# Patient Record
Sex: Male | Born: 1965 | Race: White | Marital: Married | Smoking: Never smoker
Health system: Southern US, Community
[De-identification: ages and names within clinical notes are randomized; demographics above are authoritative.]

---

## 2019-03-15 DIAGNOSIS — M25551 Pain in right hip: Secondary | ICD-10-CM | POA: Diagnosis not present

## 2019-03-15 DIAGNOSIS — M5136 Other intervertebral disc degeneration, lumbar region: Secondary | ICD-10-CM | POA: Diagnosis not present

## 2019-03-15 DIAGNOSIS — M545 Low back pain: Secondary | ICD-10-CM | POA: Diagnosis not present

## 2019-03-21 DIAGNOSIS — M1611 Unilateral primary osteoarthritis, right hip: Secondary | ICD-10-CM | POA: Diagnosis not present

## 2019-03-21 DIAGNOSIS — M5136 Other intervertebral disc degeneration, lumbar region: Secondary | ICD-10-CM | POA: Diagnosis not present

## 2019-03-21 DIAGNOSIS — M47816 Spondylosis without myelopathy or radiculopathy, lumbar region: Secondary | ICD-10-CM | POA: Diagnosis not present

## 2019-03-21 DIAGNOSIS — M25551 Pain in right hip: Secondary | ICD-10-CM | POA: Diagnosis not present

## 2019-05-08 DIAGNOSIS — M1611 Unilateral primary osteoarthritis, right hip: Secondary | ICD-10-CM | POA: Diagnosis not present

## 2019-05-08 DIAGNOSIS — M25551 Pain in right hip: Secondary | ICD-10-CM | POA: Diagnosis not present

## 2019-05-17 ENCOUNTER — Other Ambulatory Visit: Payer: Self-pay | Admitting: Orthopaedic Surgery

## 2019-05-17 DIAGNOSIS — M25551 Pain in right hip: Secondary | ICD-10-CM

## 2019-06-16 ENCOUNTER — Other Ambulatory Visit: Payer: Self-pay

## 2019-06-16 ENCOUNTER — Ambulatory Visit
Admission: RE | Admit: 2019-06-16 | Discharge: 2019-06-16 | Disposition: A | Discharge status: Home / Self Care | Payer: Self-pay | Source: Ambulatory Visit | Attending: Orthopaedic Surgery | Admitting: Orthopaedic Surgery

## 2019-06-16 DIAGNOSIS — M25551 Pain in right hip: Secondary | ICD-10-CM

## 2019-06-29 DIAGNOSIS — M1611 Unilateral primary osteoarthritis, right hip: Secondary | ICD-10-CM | POA: Diagnosis not present

## 2019-07-12 DIAGNOSIS — I10 Essential (primary) hypertension: Secondary | ICD-10-CM | POA: Diagnosis not present

## 2019-07-26 DIAGNOSIS — Z0000 Encounter for general adult medical examination without abnormal findings: Secondary | ICD-10-CM | POA: Diagnosis not present

## 2019-07-26 DIAGNOSIS — Z01818 Encounter for other preprocedural examination: Secondary | ICD-10-CM | POA: Diagnosis not present

## 2019-07-26 DIAGNOSIS — I10 Essential (primary) hypertension: Secondary | ICD-10-CM | POA: Diagnosis not present

## 2019-07-26 DIAGNOSIS — Z1331 Encounter for screening for depression: Secondary | ICD-10-CM | POA: Diagnosis not present

## 2019-08-09 DIAGNOSIS — I10 Essential (primary) hypertension: Secondary | ICD-10-CM | POA: Diagnosis not present

## 2019-08-13 DIAGNOSIS — M1611 Unilateral primary osteoarthritis, right hip: Secondary | ICD-10-CM | POA: Diagnosis not present

## 2019-08-23 DIAGNOSIS — M1611 Unilateral primary osteoarthritis, right hip: Secondary | ICD-10-CM | POA: Diagnosis not present

## 2019-09-03 DIAGNOSIS — M25551 Pain in right hip: Secondary | ICD-10-CM | POA: Diagnosis not present

## 2019-09-03 DIAGNOSIS — Z471 Aftercare following joint replacement surgery: Secondary | ICD-10-CM | POA: Diagnosis not present

## 2019-09-03 DIAGNOSIS — Z96641 Presence of right artificial hip joint: Secondary | ICD-10-CM | POA: Diagnosis not present

## 2019-11-09 DIAGNOSIS — I10 Essential (primary) hypertension: Secondary | ICD-10-CM | POA: Diagnosis not present

## 2020-06-11 ENCOUNTER — Ambulatory Visit (INDEPENDENT_AMBULATORY_CARE_PROVIDER_SITE_OTHER): Payer: BLUE CROSS/BLUE SHIELD | Admitting: Pulmonary Disease

## 2020-06-11 ENCOUNTER — Other Ambulatory Visit: Payer: Self-pay

## 2020-06-11 ENCOUNTER — Encounter: Payer: Self-pay | Admitting: Pulmonary Disease

## 2020-06-11 VITALS — BP 138/88 | HR 84 | Temp 97.6°F | Ht 72.0 in | Wt 306.2 lb

## 2020-06-11 DIAGNOSIS — R0683 Snoring: Secondary | ICD-10-CM

## 2020-06-11 NOTE — Progress Notes (Signed)
Gerald Matthews    [id number]    27-Jul-1965  Primary Care Physician:Schultz, Jonetta Speak, MD  Referring Physician: No referring provider defined for this encounter.  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 amLODipine-valsartan (EXFORGE) 10-320 MG tablet Take 1 tablet by mouth daily.  . cetirizine (ZYRTEC) 10 MG tablet Take 10 mg by mouth daily.  Marland Kitchen omeprazole (PRILOSEC) 10 MG capsule Take 10 mg by mouth daily.  . polyethylene glycol-electrolytes (NULYTELY) 420 g solution USE AS DIRECTED FOR BOWEL PREP  . rosuvastatin (CRESTOR) 10 MG tablet Take 10 mg by mouth at bedtime.   No facility-administered encounter medications on file as of 06/11/2020.    Allergies as of 06/11/2020  . (Not on File)    History reviewed. No pertinent past medical history.  History reviewed. No pertinent surgical history.  History reviewed. No pertinent family history.  Social History   Socioeconomic History  . Marital status: Married    Spouse name: Not on file  . Number of children: Not on file  . Years of education: Not on file  . Highest education level: Not on file  Occupational History  . Not on file  Tobacco Use  . Smoking status: Never Smoker  . Smokeless tobacco: Never Used  Substance and Sexual Activity  . Alcohol use: Not on file  . Drug use: Not  on file  . Sexual activity: Not on file  Other Topics Concern  . Not on file  Social History Narrative  . Not on file   Social Determinants of Health   Financial Resource Strain: Not on file  Food Insecurity: Not on file  Transportation Needs: Not on file  Physical Activity: Not on file  Stress: Not on file  Social Connections: Not on file  Intimate Partner Violence: Not on file    Review of Systems  Respiratory: Positive for shortness of breath.   Psychiatric/Behavioral: Positive for sleep disturbance.    Vitals:   06/11/20 1606  BP: 138/88  Pulse: 84  Temp: 97.6 F (36.4 C)  SpO2: 97%     Physical Exam Constitutional:      Appearance: He is obese.  HENT:     Head: Normocephalic.     Mouth/Throat:     Mouth: Mucous membranes are moist.     Comments: Mallampati 4, crowded oropharynx, macroglossia Eyes:     General:        Right eye: No discharge.        Left eye: No discharge.     Pupils: Pupils are equal, round, and reactive to light.  Cardiovascular:     Rate and Rhythm: Normal rate and regular rhythm.     Heart sounds: No murmur heard. No friction rub.  Pulmonary:     Effort: No respiratory distress.     Breath sounds: No stridor. No wheezing  or rhonchi.  Musculoskeletal:     Cervical back: No rigidity or tenderness.  Neurological:     Mental Status: He is alert.  Psychiatric:        Mood and Affect: Mood normal.   Epworth Sleepiness Scale of 11   Assessment:  Excessive daytime sleepiness  High probability of significant obstructive sleep apnea  Pathophysiology of sleep disordered breathing discussed with the patient Treatment options for sleep disordered breathing discussed with the patient  Importance of weight loss discussed with the patient  Plan/Recommendations: We will schedule patient for home sleep study  Encouraged to work on weight loss efforts  Tentative follow-up in about 4 to 5 months  Encouraged to call with any  significant concerns   Virl Diamond MD [facility] 06/11/2020, 5:15 PM  CC: No ref. provider found

## 2020-06-11 NOTE — Patient Instructions (Signed)
High probability of significant obstructive sleep apnea  I will schedule you for a home sleep study We will update your results as soon as reviewed  I will see you back in 4 to 5 months  Call with significant concerns  Encourage graded exercises, weight loss efforts   Sleep Apnea Sleep apnea affects breathing during sleep. It causes breathing to stop for a short time or to become shallow. It can also increase the risk of:  Heart attack.  Stroke.  Being very overweight (obese).  Diabetes.  Heart failure.  Irregular heartbeat. The goal of treatment is to help you breathe normally again. What are the causes? There are three kinds of sleep apnea:  Obstructive sleep apnea. This is caused by a blocked or collapsed airway.  Central sleep apnea. This happens when the brain does not send the right signals to the muscles that control breathing.  Mixed sleep apnea. This is a combination of obstructive and central sleep apnea. The most common cause of this condition is a collapsed or blocked airway. This can happen if:  Your throat muscles are too relaxed.  Your tongue and tonsils are too large.  You are overweight.  Your airway is too small.   What increases the risk?  Being overweight.  Smoking.  Having a small airway.  Being older.  Being male.  Drinking alcohol.  Taking medicines to calm yourself (sedatives or tranquilizers).  Having family members with the condition. What are the signs or symptoms?  Trouble staying asleep.  Being sleepy or tired during the day.  Getting angry a lot.  Loud snoring.  Headaches in the morning.  Not being able to focus your mind (concentrate).  Forgetting things.  Less interest in sex.  Mood swings.  Personality changes.  Feelings of sadness (depression).  Waking up a lot during the night to pee (urinate).  Dry mouth.  Sore throat. How is this diagnosed?  Your medical history.  A physical exam.  A  test that is done when you are sleeping (sleep study). The test is most often done in a sleep lab but may also be done at home. How is this treated?  Sleeping on your side.  Using a medicine to get rid of mucus in your nose (decongestant).  Avoiding the use of alcohol, medicines to help you relax, or certain pain medicines (narcotics).  Losing weight, if needed.  Changing your diet.  Not smoking.  Using a machine to open your airway while you sleep, such as: ? An oral appliance. This is a mouthpiece that shifts your lower jaw forward. ? A CPAP device. This device blows air through a mask when you breathe out (exhale). ? An EPAP device. This has valves that you put in each nostril. ? A BPAP device. This device blows air through a mask when you breathe in (inhale) and breathe out.  Having surgery if other treatments do not work. It is important to get treatment for sleep apnea. Without treatment, it can lead to:  High blood pressure.  Coronary artery disease.  In men, not being able to have an erection (impotence).  Reduced thinking ability.   Follow these instructions at home: Lifestyle  Make changes that your doctor recommends.  Eat a healthy diet.  Lose weight if needed.  Avoid alcohol, medicines to help you relax, and some pain medicines.  Do not use any products that contain nicotine or tobacco, such as cigarettes, e-cigarettes, and chewing tobacco. If you need help quitting,  ask your doctor. General instructions  Take over-the-counter and prescription medicines only as told by your doctor.  If you were given a machine to use while you sleep, use it only as told by your doctor.  If you are having surgery, make sure to tell your doctor you have sleep apnea. You may need to bring your device with you.  Keep all follow-up visits as told by your doctor. This is important. Contact a doctor if:  The machine that you were given to use during sleep bothers you or does  not seem to be working.  You do not get better.  You get worse. Get help right away if:  Your chest hurts.  You have trouble breathing in enough air.  You have an uncomfortable feeling in your back, arms, or stomach.  You have trouble talking.  One side of your body feels weak.  A part of your face is hanging down. These symptoms may be an emergency. Do not wait to see if the symptoms will go away. Get medical help right away. Call your local emergency services (911 in the [place] Do not drive yourself to the hospital. Summary  This condition affects breathing during sleep.  The most common cause is a collapsed or blocked airway.  The goal of treatment is to help you breathe normally while you sleep. This information is not intended to replace advice given to you by your health care provider. Make sure you discuss any questions you have with your health care provider. Document Revised: 10/21/2017 Document Reviewed: 08/30/2017 Elsevier Patient Education  2021 [employer].

## 2020-07-31 ENCOUNTER — Other Ambulatory Visit: Payer: Self-pay

## 2020-07-31 ENCOUNTER — Ambulatory Visit: Payer: BLUE CROSS/BLUE SHIELD

## 2020-08-06 ENCOUNTER — Ambulatory Visit: Payer: BLUE CROSS/BLUE SHIELD

## 2020-08-06 ENCOUNTER — Other Ambulatory Visit: Payer: Self-pay

## 2020-08-06 DIAGNOSIS — G4733 Obstructive sleep apnea (adult) (pediatric): Secondary | ICD-10-CM

## 2020-08-09 ENCOUNTER — Telehealth: Payer: Self-pay | Admitting: Pulmonary Disease

## 2020-08-09 DIAGNOSIS — G4733 Obstructive sleep apnea (adult) (pediatric): Secondary | ICD-10-CM

## 2020-08-09 DIAGNOSIS — R0683 Snoring: Secondary | ICD-10-CM

## 2020-08-09 NOTE — Telephone Encounter (Signed)
Call patient  Sleep study result  Date of study: 08/06/2020  Impression: Severe obstructive sleep apnea Severe oxygen desaturations  Recommendation: Recommend in lab titration study for treatment of severe obstructive sleep apnea  Auto titrating CPAP may be an option of treatment but may not adequately treat the severe oxygen desaturations  Weight loss measures encouraged Caution against driving when sleepy  Follow-up as previously scheduled

## 2020-08-18 NOTE — Telephone Encounter (Signed)
I called and spoke with patient regarding HST results and Dr. Beverley Fiedler. Patient verbalized understanding and I sent in order for CPAP titration study and informed patient that the lab will be in contact to schedule appt. Patient verbalized understanding, nothing further needed.

## 2020-10-01 ENCOUNTER — Ambulatory Visit (HOSPITAL_BASED_OUTPATIENT_CLINIC_OR_DEPARTMENT_OTHER): Payer: BLUE CROSS/BLUE SHIELD | Attending: Pulmonary Disease | Admitting: Pulmonary Disease

## 2020-10-01 ENCOUNTER — Other Ambulatory Visit: Payer: Self-pay

## 2020-10-01 ENCOUNTER — Encounter (HOSPITAL_BASED_OUTPATIENT_CLINIC_OR_DEPARTMENT_OTHER): Payer: Self-pay | Admitting: Pulmonary Disease

## 2020-10-01 DIAGNOSIS — R0683 Snoring: Secondary | ICD-10-CM

## 2020-10-01 DIAGNOSIS — G4733 Obstructive sleep apnea (adult) (pediatric): Secondary | ICD-10-CM

## 2020-10-01 DIAGNOSIS — Z79899 Other long term (current) drug therapy: Secondary | ICD-10-CM | POA: Diagnosis not present

## 2020-10-01 HISTORY — DX: Snoring: R06.83

## 2020-10-06 ENCOUNTER — Telehealth: Payer: Self-pay | Admitting: Pulmonary Disease

## 2020-10-06 DIAGNOSIS — G4733 Obstructive sleep apnea (adult) (pediatric): Secondary | ICD-10-CM

## 2020-10-06 DIAGNOSIS — R0683 Snoring: Secondary | ICD-10-CM

## 2020-10-06 NOTE — Procedures (Signed)
POLYSOMNOGRAPHY  Last, First: Gerald Matthews MRN: [id number] Gender: Male Age (years): 71 Weight (lbs): 300 DOB: 09/01/1965 BMI: 41 Primary Care: No PCP Epworth Score: <br> Referring: Tomma Lightning MD Technician: Ulyess Mort Interpreting: Tomma Lightning MD Study Type: BiPAP Ordered Study Type: CPAP Study date: 10/01/2020 Location: [facility] CLINICAL INFORMATION Gerald Matthews is a 55 year old Male and was referred to the sleep center for evaluation of N/A. Indications include N/A.   Most recent polysomnogram dated 08/06/2020 revealed an AHI of 91.1/h. MEDICATIONS Patient self administered medications include: METOPROLOL, CRESTOR, ZYRTEC, OMEPRAZOLE. Medications administered during study include No sleep medicine administered.  SLEEP STUDY TECHNIQUE The patient underwent an attended overnight polysomnography titration to assess the effects of BIPAP therapy. The following variables were monitored: EEG(C4-A1, C3-A2, O1-A2, O2-A1), EOG, submental and leg EMG, ECG, oxyhemoglobin saturation by pulse oximetry, thoracic and abdominal respiratory effort belts, nasal/oral airflow by pressure sensor, body position sensor and snoring sensor. BIPAP pressure was titrated to eliminate apneas, hypopneas and oxygen desaturation. Hypopneas were scored per AASM definition IB (4% desaturation)  TECHNICIAN COMMENTS Comments added by Technician: Patient was ordered as a Cpap titration. Comments added by Scorer: N/A SLEEP ARCHITECTURE The study was initiated at 9:50:44 PM and terminated at 5:12:20 AM. Total recorded time was 441.6 minutes. EEG confirmed total sleep time was 230.3 minutes yielding a sleep efficiency of 52.1%%. Sleep onset after lights out was 58.8 minutes with a REM latency of 90.0 minutes. The patient spent 21.5%% of the night in stage N1 sleep, 64.0%% in stage N2 sleep, 0.0%% in stage N3 and 14.6% in REM. The Arousal Index was 52.9/hour. RESPIRATORY PARAMETERS The overall AHI was  46.1 per hour, and the RDI was 62.3 events/hour with a central apnea index of 15.4per hour. The most appropriate setting of BiPAP was 29/25 cm H2O. At this setting, the sleep efficiency was 98 % and the patient was supine for 0%. The AHI was 2.6 events per hour, and the RDI was 5.1 events/hour (with 15.4 central events) and the arousal index was 12.8 per hour.The oxygen nadir was 93.0% during sleep.    The cumulative time under 88% oxygen saturation was 5.5 minutes  LEG MOVEMENT DATA The total leg movements were 63 with a resulting leg movement index of 16.4. Associated arousal with leg movement index was 1.3. CARDIAC DATA The underlying cardiac rhythm was most consistent with sinus rhythm. Mean heart rate during sleep was 60.6 bpm. Additional rhythm abnormalities include PVCs.  IMPRESSIONS - Severe Obstructive Sleep apnea(OSA) Optimal pressure attained. - Electrocardiographic data showed presence of PVCs. - Mild Oxygen Desaturation - The patient snored with moderate snoring volume. - No significant periodic leg movements(PLMs) during sleep. However, no significant associated arousals.  DIAGNOSIS - Obstructive Sleep Apnea (G47.33)  RECOMMENDATIONS - Titrated to BiPAP therapy on 29/25 cm H2O - Pressure may not be well tolerated, recommend BiPAP ST 24/20 cm H2O with a backup rate of 12, with a medium size [employer] full face mask Simplus mask and heated humidification - Avoid alcohol, sedatives and other CNS depressants that may worsen sleep apnea and disrupt normal sleep architecture. - Sleep hygiene should be reviewed to assess factors that may improve sleep quality. - Weight management and regular exercise should be initiated or continued. - Close clinical follow-up optimization of treatment. - Return to Sleep Center for re-evaluation after 4 weeks of therapy  [Electronically signed] 10/06/2020 07:37 AM  Virl Diamond MD NPI: [id number]

## 2020-10-06 NOTE — Telephone Encounter (Signed)
Call patient  Sleep study result  Date of study: 9/14/202  Impression: Severe obstructive sleep apnea  Recommendation: DME referral  Recommend BiPAP ST 24/20 cm H2O with a backup rate of 12, with a medium size [employer] full face mask Simplus mask and heated humidification  Encourage weight loss measures  Follow-up in the office 4 to 6 weeks following initiation of treatment

## 2020-10-08 NOTE — Telephone Encounter (Signed)
I called and spoke with patient regarding HST results and Dr. Beverley Fiedler. Patient verbalized understanding and I have sent in order for new BiPAP start and informed patient DME company will be in touch next and to call the office and make a 4-6 week f/u appt after getting machine. Patient verbalized understanding, nothing further needed.

## 2020-10-24 ENCOUNTER — Telehealth: Payer: Self-pay | Admitting: Pulmonary Disease

## 2020-10-24 NOTE — Progress Notes (Signed)
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

## 2020-10-24 NOTE — Telephone Encounter (Signed)
I did place an addendum to the sleep study results and a note in the chart

## 2020-10-24 NOTE — Telephone Encounter (Signed)
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

## 2020-10-24 NOTE — Telephone Encounter (Signed)
I have been advised that we are in need of the following to process this order:    Updated RX and Rx needs to state BiPAP ST and include correct diagnosis code, in this case it would actually be G47.37 (Central sleep apnea). Also need signed interp for the 10/01/20 TT. The signature page that was included with the titration was the Rx for the TT, signed 08/18/20   Thank you,   Luellen Pucker

## 2020-10-29 ENCOUNTER — Telehealth: Payer: Self-pay | Admitting: Pulmonary Disease

## 2020-10-29 DIAGNOSIS — R0683 Snoring: Secondary | ICD-10-CM

## 2020-10-29 DIAGNOSIS — G4733 Obstructive sleep apnea (adult) (pediatric): Secondary | ICD-10-CM

## 2020-10-29 NOTE — Telephone Encounter (Signed)
I have placed the new order for BIPAP.  Will forward back to [facility] to make them aware.

## 2020-10-29 NOTE — Telephone Encounter (Signed)
We will send the order

## 2020-10-29 NOTE — Telephone Encounter (Signed)
Burna Forts; Darius Bump Hello,   I just got word back that this will not work. I was told the order must be updated to BIPAP ST and correct dx code and it has to be on the order , can not use a note.   Thank you,   Luellen Pucker

## 2021-02-02 ENCOUNTER — Telehealth: Payer: Self-pay | Admitting: Pulmonary Disease

## 2021-02-02 NOTE — Telephone Encounter (Signed)
BiPAP compliance report reviewed  98% compliance up until 02/01/2021, 39-month download Average use of 5 hours 27 minutes Machine set up 24/20 with backup rate of 12 Residual AHI of 6.2 Does appear to have occasional leaks  Follow-up as previously scheduled

## 2021-02-10 ENCOUNTER — Telehealth: Payer: Self-pay | Admitting: Pulmonary Disease

## 2021-02-10 NOTE — Telephone Encounter (Signed)
Called patient and told him I would send a message to Francesco Runner to figure out where that SD card is and that she would get back in touch with him about the reading and the card.

## 2021-02-11 NOTE — Telephone Encounter (Signed)
I called the patient and he is aware that he can pick up his SD card and he will pick up. I have made a follow up for the patient.

## 2021-03-31 ENCOUNTER — Other Ambulatory Visit: Payer: Self-pay

## 2021-03-31 ENCOUNTER — Ambulatory Visit: Payer: BLUE CROSS/BLUE SHIELD | Admitting: Pulmonary Disease

## 2021-03-31 ENCOUNTER — Encounter: Payer: Self-pay | Admitting: Pulmonary Disease

## 2021-03-31 VITALS — BP 138/80 | HR 89 | Temp 98.0°F | Ht 72.0 in | Wt 314.0 lb

## 2021-03-31 DIAGNOSIS — G4733 Obstructive sleep apnea (adult) (pediatric): Secondary | ICD-10-CM

## 2021-03-31 NOTE — Patient Instructions (Signed)
I will see you about 3 months from here ? ?We will contact the medical supply company to change her pressure from 24/20 to 22/18 ? ?Call at any time if you feel the pressure is not well-tolerated ? ?Call with any other significant concerns ? ?Continue weight loss efforts ?

## 2021-03-31 NOTE — Progress Notes (Signed)
? ?      Gerald Matthews    [id number]    [birth year] ? ?Primary Care Physician:Schultz, Jonetta Speak, MD ? ?Referring Physician: Paulina Fusi, MD ?[address] ?[address] ?[place],  [place] [address] ? ?Chief complaint:  ?Patient being seen for severe obstructive sleep apnea on BiPAP therapy ? ?HPI: ? ?BiPAP is helping ?Feels better ?Daytime sleepiness is better ?Feels pressure may be too high ? ?Is able to get a good number of hours of sleep ?Wakes up feeling he has more energy ?Activity tolerance is better ? ?Usually goes to bed between 11 and 12 ?Takes him about 30 minutes to an hour to fall asleep ?4-5 awakenings ?Final wake up time between 7 AM and 8 AM ? ?Admits to dryness of his mouth in the morning ?Occasional headaches ?Rare night sweats ?Dad did snore ?Never smoked ? ?He does have a history of hypertension, allergies, hypercholesterolemia ?Blood pressure is better ? 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 ?Outpatient Encounter Medications as of 03/31/2021  ?Medication Sig  ? amLODipine-valsartan (EXFORGE) 10-320 MG tablet Take 1 tablet by mouth daily.  ? cetirizine (ZYRTEC) 10 MG tablet Take 10 mg by mouth as needed for allergies.  ? omeprazole (PRILOSEC) 10 MG capsule Take 10 mg by mouth as needed.  ? rosuvastatin (CRESTOR) 10 MG tablet Take 10 mg by mouth at bedtime.  ? [DISCONTINUED] cetirizine (ZYRTEC) 10 MG tablet Take 10 mg by mouth daily. (Patient not taking: Reported on 03/31/2021)  ? [DISCONTINUED] metoprolol succinate (TOPROL-XL) 100 MG 24 hr tablet Take 100 mg by mouth daily. (Patient not taking: Reported on 03/31/2021)  ? [DISCONTINUED] polyethylene glycol-electrolytes (NULYTELY) 420 g solution USE AS DIRECTED FOR BOWEL PREP (Patient not taking: Reported on 03/31/2021)  ? ?No facility-administered encounter medications on file as of 03/31/2021.  ? ? ?Allergies as of 03/31/2021  ? (No Known Allergies)  ? ? ?Past Medical History:  ?Diagnosis Date  ? Snoring  10/01/2020  ? ? ?No past surgical history on file. ? ?No family history on file. ? ?Social History  ? ?Socioeconomic History  ? Marital status: Married  ?  Spouse name: Not on file  ? Number of children: Not on file  ? Years of education: Not on file  ? Highest education level: Not on file  ?Occupational History  ? Not on file  ?Tobacco Use  ? Smoking status: Never  ? Smokeless tobacco: Never  ?Substance and Sexual Activity  ? Alcohol use: Not on file  ? Drug use: Not on file  ? Sexual activity: Not on file  ?Other Topics Concern  ? Not on file  ?Social History Narrative  ? Not on file  ? ?Social Determinants of Health  ? ?Financial Resource Strain: Not on file  ?Food Insecurity: Not on file  ?Transportation Needs: Not on file  ?Physical Activity: Not on file  ?Stress: Not on file  ?Social Connections: Not on file  ?Intimate Partner Violence: Not on file  ? ? ?Review of Systems  ?Respiratory:  Positive for apnea and shortness of breath.   ?Psychiatric/Behavioral:  Positive for sleep disturbance.   ? ?Vitals:  ? 03/31/21 1045  ?BP: 138/80  ?Pulse: 89  ?Temp: 98 ?F (36.7 ?C)  ?SpO2: 100%  ? ? ? ?Physical Exam ?Constitutional:   ?   Appearance: He is obese.  ?HENT:  ?   Head: Normocephalic.  ?   Mouth/Throat:  ?   Mouth: Mucous membranes are  moist.  ?   Comments: Mallampati 4, crowded oropharynx, macroglossia ?Eyes:  ?   General:     ?   Right eye: No discharge.     ?   Left eye: No discharge.  ?   Pupils: Pupils are equal, round, and reactive to light.  ?Cardiovascular:  ?   Rate and Rhythm: Normal rate and regular rhythm.  ?   Heart sounds: No murmur heard. ?  No friction rub.  ?Pulmonary:  ?   Effort: No respiratory distress.  ?   Breath sounds: No stridor. No wheezing or rhonchi.  ?Musculoskeletal:  ?   Cervical back: No rigidity or tenderness.  ?Neurological:  ?   Mental Status: He is alert.  ?Psychiatric:     ?   Mood and Affect: Mood normal.  ?Epworth Sleepiness Scale of 11 ? ?Compliance data reviewed showing 94%  compliance with BiPAP ?BiPAP set between 24/20 with a backup rate of 12 ?Residual AHI of 4.5 ? ?Assessment:  ?Severe obstructive sleep apnea ? ?Tolerating BiPAP well ? ?Feels BiPAP pressure may be too high ? ?Significant improvement in symptoms ? ?Does note improved sleep quality ? ?Obesity ?-Weight is trending down ? ?Plan/Recommendations: ? ?We will make BiPAP pressure changes ? ?We will decrease pressure from 24/22 22/18, maintain backup rate of 12 ? ?Encourage continuing weight loss efforts ? ?As we are still making changes to his pressure ?We will see him in 3 months ? ?Encouraged to give [place] a call with any significant concerns or not tolerating pressure changes ? ?Significance of weight loss efforts discussed ? ? ?Virl Diamond MD ?[facility] ?03/31/2021, 11:20 AM ? ?CC: Paulina Fusi, MD ? ? ?

## 2021-04-10 ENCOUNTER — Telehealth: Payer: Self-pay | Admitting: Pulmonary Disease

## 2021-04-10 DIAGNOSIS — G4733 Obstructive sleep apnea (adult) (pediatric): Secondary | ICD-10-CM

## 2021-04-17 NOTE — Telephone Encounter (Signed)
Not changing from BiPAP ST ? ?Decrease pressure to 22/18, backup rate of 12 ?

## 2021-04-17 NOTE — Telephone Encounter (Signed)
Called and spoke with Melissa to let her know that Dr. Wynona Neat does not want to change machine just the settings. Gave her the settings in the message and she said she would talk to her RT about it and let us know if there is any issues. Nothing further needed at this time.  ?

## 2021-04-21 NOTE — Addendum Note (Signed)
Addended by: Katrinka Blazing R on: 04/21/2021 01:45 PM ? ? Modules accepted: Orders ? ?

## 2021-07-01 ENCOUNTER — Ambulatory Visit: Payer: BLUE CROSS/BLUE SHIELD | Admitting: Pulmonary Disease

## 2021-07-08 ENCOUNTER — Encounter: Payer: Self-pay | Admitting: Pulmonary Disease

## 2021-07-08 ENCOUNTER — Ambulatory Visit: Payer: BLUE CROSS/BLUE SHIELD | Admitting: Pulmonary Disease

## 2021-07-08 VITALS — BP 126/76 | HR 82 | Temp 98.0°F | Ht 72.0 in | Wt 319.6 lb

## 2021-07-08 DIAGNOSIS — Z9989 Dependence on other enabling machines and devices: Secondary | ICD-10-CM

## 2021-07-08 DIAGNOSIS — G4733 Obstructive sleep apnea (adult) (pediatric): Secondary | ICD-10-CM

## 2021-07-08 NOTE — Patient Instructions (Signed)
I will see you 3 months from here  It appears that the machine is working well  The number of events that the machine is sensing is much lower compared to when you are at higher pressures  Continue to work on weight loss  Call with significant concerns

## 2021-07-08 NOTE — Progress Notes (Signed)
Gerald Matthews    [id number]    1965/06/15  Primary Care Physician:Schultz, Jonetta Speak, MD  Referring Physician: Paulina Fusi, MD [address] [place],  [place] [address]  Chief complaint:  Patient being seen for severe obstructive sleep apnea on BiPAP therapy  HPI:  BiPAP is helping We had reduced his BiPAP pressures from 24/20-22/18 at last visit Continues to benefit from BiPAP use Feels better overall  No longer sensing that the pressures may be too high  He recently did change his mask and is getting used to the new mask Wakes up feeling more energetic Activity tolerance is better Has managed to lose some weight  Continues to feel better overall  Usually goes to bed between 11 and 12 Takes him about 30 minutes to an hour to fall asleep 4-5 awakenings Final wake up time between 7 AM and 8 AM  No longer with significant dryness was mild in the mornings No longer having headaches  Never smoker  He does have a history of hypertension, allergies, hypercholesterolemia Blood pressure is better  No pertinent occupational history 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   cetirizine (ZYRTEC) 10 MG tablet Take 10 mg by mouth as needed for allergies.   omeprazole (PRILOSEC) 10 MG capsule Take 10 mg by mouth as needed.   rosuvastatin (CRESTOR) 10 MG tablet Take 10 mg by mouth at bedtime.   No facility-administered encounter medications on file as of 07/08/2021.    Allergies as of 07/08/2021   (No Known Allergies)    Past Medical History:  Diagnosis Date   Snoring 10/01/2020    No past surgical history on file.  No family history on file.  Social History   Socioeconomic History   Marital status: Married    Spouse name: Not on file   Number of children: Not on file   Years of education:  Not on file   Highest education level: Not on file  Occupational History   Not on file  Tobacco Use   Smoking status: Never   Smokeless tobacco: Never  Substance and Sexual Activity   Alcohol use: Not on file   Drug use: Not on file   Sexual activity: Not on file  Other Topics Concern   Not on file  Social History Narrative   Not on file   Social Determinants of Health   Financial Resource Strain: Not on file  Food Insecurity: Not on file  Transportation Needs: Not on file  Physical Activity: Not on file  Stress: Not on file  Social Connections: Not on file  Intimate Partner Violence: Not on file    Review of Systems  Respiratory:  Positive for apnea and shortness of breath.   Psychiatric/Behavioral:  Positive for sleep disturbance.     Vitals:   07/08/21 1154  BP: 126/76  Pulse: 82  Temp: 98 F (36.7 C)  SpO2: 98%     Physical Exam Constitutional:      Appearance: He is obese.  HENT:     Head: Normocephalic.     Mouth/Throat:     Mouth: Mucous membranes are moist.     Comments: Mallampati 4, crowded oropharynx, macroglossia Eyes:     General:        Right eye: No discharge.  Left eye: No discharge.     Pupils: Pupils are equal, round, and reactive to light.  Cardiovascular:     Rate and Rhythm: Normal rate and regular rhythm.     Heart sounds: No murmur heard.    No friction rub.  Pulmonary:     Effort: No respiratory distress.     Breath sounds: No stridor. No wheezing or rhonchi.  Musculoskeletal:     Cervical back: No rigidity or tenderness.  Neurological:     Mental Status: He is alert.  Psychiatric:        Mood and Affect: Mood normal.  Epworth Sleepiness Scale of 11  Compliance data reviewed showing 94% compliance with BiPAP BiPAP set between 24/20 with a backup rate of 12 Residual AHI of 4.5  04/09/21-07/07/21 compliance data reviewed with the patient showing excellent compliance with BiPAP of 99% Pressure settings of  22/18 Residual AHI of 3.2  Assessment:  Severe obstructive sleep apnea  Continues to tolerate BiPAP well  Tolerating BiPAP pressures well  Continues to describe significant improvement in symptoms  Obesity -Weight is trending down  Plan/Recommendations:  Maintain current BiPAP pressure settings  Can consider decreasing pressures if he will been to be having any significant issues with pressure settings  We will give him some time to get used to his new mask  Follow-up in 3 months  Encouraged to give [place] a call with any significant concerns or not tolerating pressure changes  Significance of weight loss efforts discussed   Virl Diamond MD [facility] 07/08/2021, 12:01 PM  CC: Paulina Fusi, MD

## 2021-10-12 ENCOUNTER — Ambulatory Visit: Payer: BLUE CROSS/BLUE SHIELD | Admitting: Pulmonary Disease

## 2021-10-22 ENCOUNTER — Encounter: Payer: Self-pay | Admitting: Pulmonary Disease

## 2021-10-22 ENCOUNTER — Ambulatory Visit: Payer: BLUE CROSS/BLUE SHIELD | Admitting: Pulmonary Disease

## 2021-10-22 VITALS — BP 160/90 | HR 80 | Temp 97.9°F | Ht 72.0 in | Wt 315.0 lb

## 2021-10-22 DIAGNOSIS — G4733 Obstructive sleep apnea (adult) (pediatric): Secondary | ICD-10-CM | POA: Diagnosis not present

## 2021-10-22 NOTE — Progress Notes (Signed)
Gerald Matthews    [id number]    Sep 18, 1965  Primary Care Physician:Schultz, Jonetta Speak, MD  Referring Physician: Paulina Fusi, MD [address] [place],  [place] [address]  Chief complaint:  Patient being seen for severe obstructive sleep apnea on BiPAP therapy  HPI:  BiPAP is helping He continues to tolerate BiPAP nightly Pressure setting of 22/18  He feels well overall  Sometimes has a sense of like a lot of pressure in the morning but not enough to cause him any significant discomfort  Mask fits well Wakes up feeling more energetic Activity tolerance is good Continues to work on weight loss  Continues to feel better overall  Usually goes to bed between 11 and 12 Takes him about 30 minutes to an hour to fall asleep 4-5 awakenings Final wake up time between 7 AM and 8 AM  No longer with significant dryness was mild in the mornings No longer having headaches  Never smoker  He does have a history of hypertension, allergies, hypercholesterolemia Blood pressure is better  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   cetirizine (ZYRTEC) 10 MG tablet Take 10 mg by mouth as needed for allergies.   omeprazole (PRILOSEC) 10 MG capsule Take 10 mg by mouth as needed.   rosuvastatin (CRESTOR) 10 MG tablet Take 10 mg by mouth at bedtime.   No facility-administered encounter medications on file as of 10/22/2021.    Allergies as of 10/22/2021   (No Known Allergies)    Past Medical History:  Diagnosis Date   Snoring 10/01/2020    No past surgical history on file.  No family history on file.  Social History   Socioeconomic History   Marital status: Married    Spouse name: Not on file   Number of children: Not on file   Years of education: Not on file   Highest  education level: Not on file  Occupational History   Not on file  Tobacco Use   Smoking status: Never   Smokeless tobacco: Never  Substance and Sexual Activity   Alcohol use: Not on file   Drug use: Not on file   Sexual activity: Not on file  Other Topics Concern   Not on file  Social History Narrative   Not on file   Social Determinants of Health   Financial Resource Strain: Not on file  Food Insecurity: Not on file  Transportation Needs: Not on file  Physical Activity: Not on file  Stress: Not on file  Social Connections: Not on file  Intimate Partner Violence: Not on file    Review of Systems  Respiratory:  Positive for apnea and shortness of breath.   Psychiatric/Behavioral:  Positive for sleep disturbance.     Vitals:   10/22/21 1024  BP: (!) 165/84  Pulse: 80  Temp: 97.9 F (36.6 C)  SpO2: 98%     Physical Exam Constitutional:      Appearance: He is obese.  HENT:     Head: Normocephalic.     Mouth/Throat:     Mouth: Mucous membranes are moist.     Comments: Mallampati 4, crowded oropharynx, macroglossia Eyes:     General:        Right eye: No discharge.        Left  eye: No discharge.     Pupils: Pupils are equal, round, and reactive to light.  Cardiovascular:     Rate and Rhythm: Normal rate and regular rhythm.     Heart sounds: No murmur heard.    No friction rub.  Pulmonary:     Effort: No respiratory distress.     Breath sounds: No stridor. No wheezing or rhonchi.  Musculoskeletal:     Cervical back: No rigidity or tenderness.  Neurological:     Mental Status: He is alert.  Psychiatric:        Mood and Affect: Mood normal.   90-day compliance shows 99% compliant with CPAP Settings of 22/18, backup rate of 12 Residual AHI of 2.5   Assessment:  Severe obstructive sleep apnea -Adequately treated with BiPAP  Continues to tolerate BiPAP well -Appears to be tolerating BiPAP pressures well  No significant issues  Obesity -Weight is  trending down  Plan/Recommendations:  Maintain current BiPAP pressure settings  Encouraged to call us with significant concerns  Continue weight loss efforts  I will see him back in about 6 months   Sherrilyn Rist MD [facility] 10/22/2021, 10:32 AM  CC: Nicoletta Dress, MD

## 2021-10-22 NOTE — Patient Instructions (Signed)
Follow-up in 6 months  Continue using BiPAP on a regular basis  Call us with significant concerns

## 2021-11-13 IMAGING — MR MR HIP*R* W/O CM
6 series · 35 of 40 positions shown · non-contrast
Comparison: None.

CLINICAL DATA: Right hip pain.  No known injury.

EXAM:
MR OF THE RIGHT HIP WITHOUT CONTRAST
TECHNIQUE: Multiplanar, multisequence MR imaging was performed. No intravenous
contrast was administered.

[Series 5: T1 · coronal · 4.0mm · 0.99mm/px · 8 of 40 slices shown]
[im 1/40]
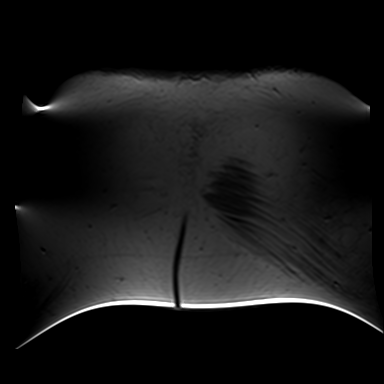
[im 6/40]
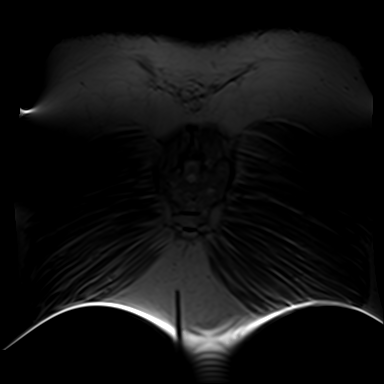
[im 12/40]
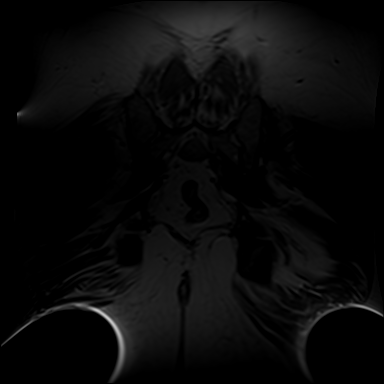
[im 17/40]
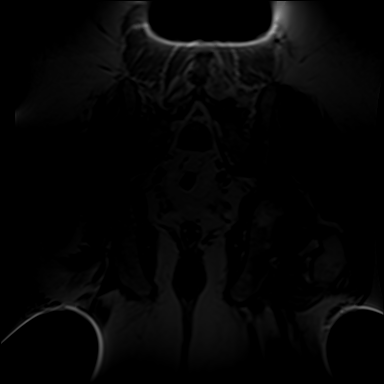
[im 23/40]
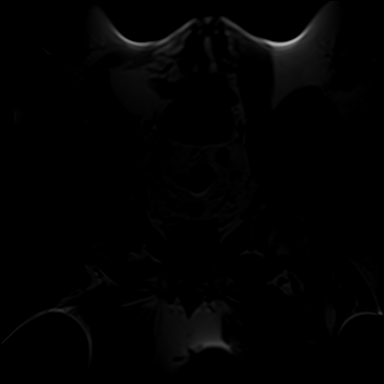
[im 28/40]
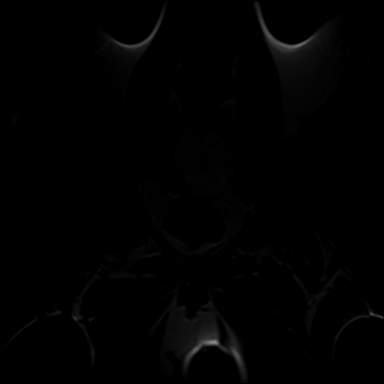
[im 34/40]
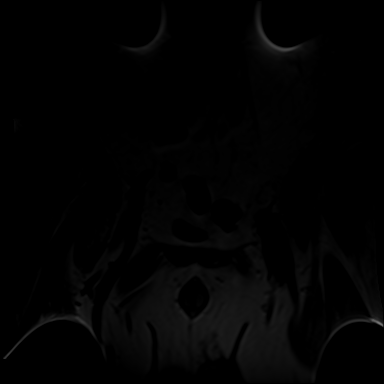
[im 40/40]
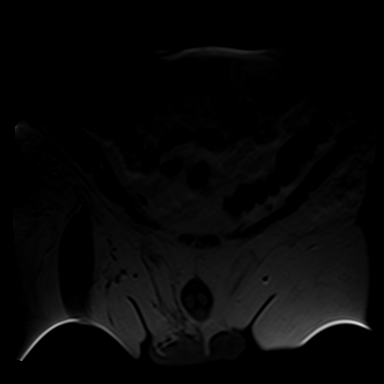

[Series 6: T2 fat-sat · coronal · 4.0mm · 0.74mm/px · 9 of 40 slices shown (1 of 3)]
[im 1/40]
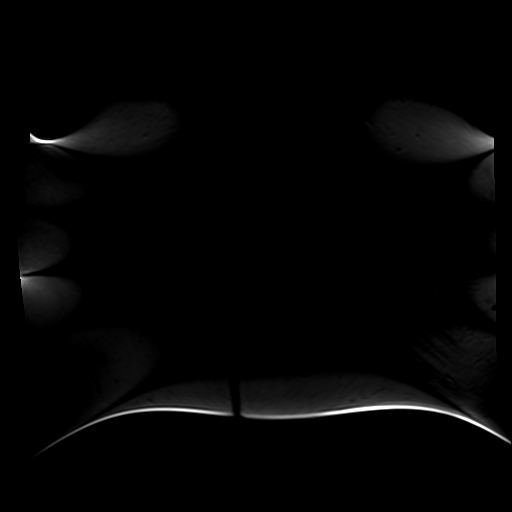
[im 5/40]
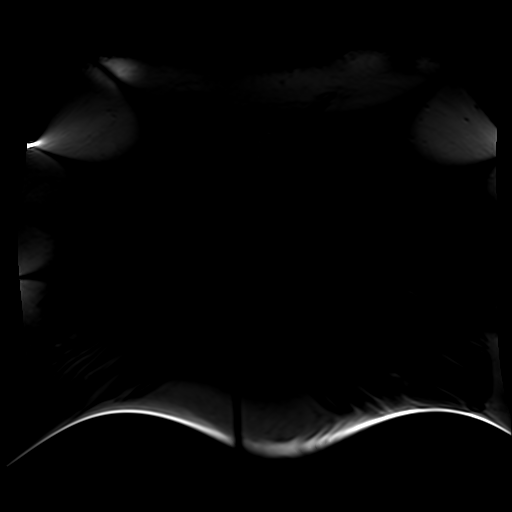
[im 10/40]
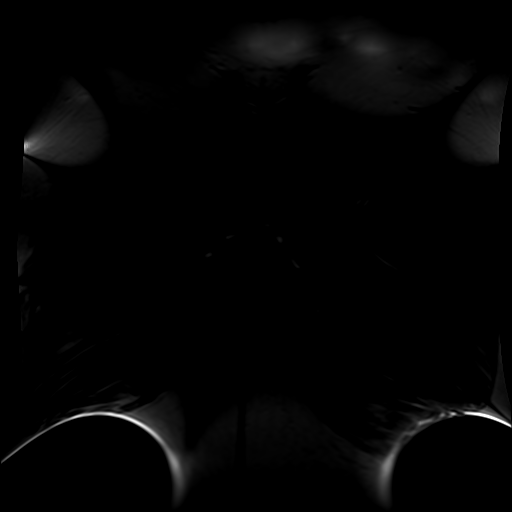
[im 15/40]
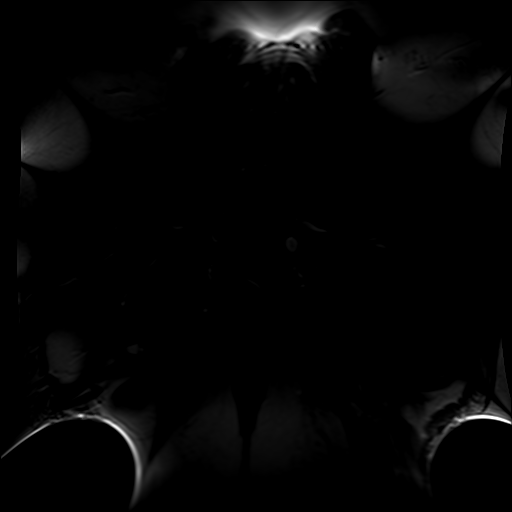
[im 20/40]
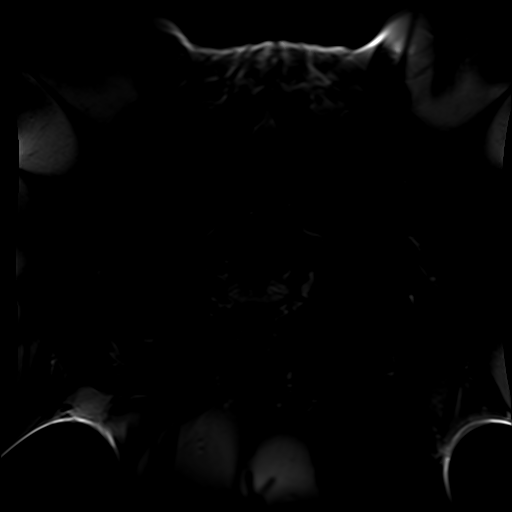
[im 25/40]
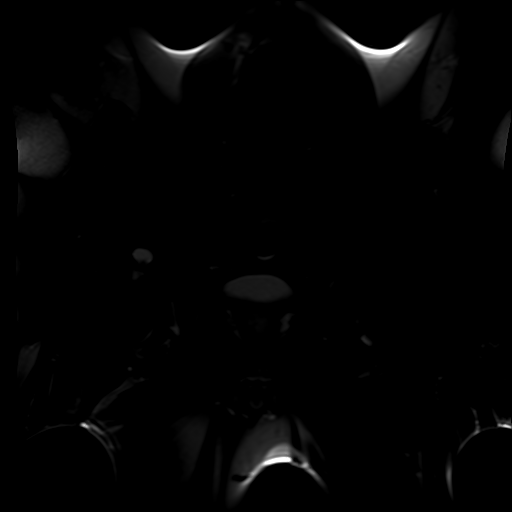
[im 30/40]
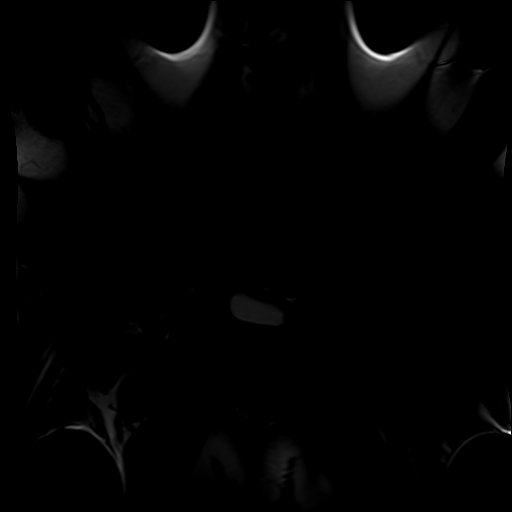
[im 35/40]
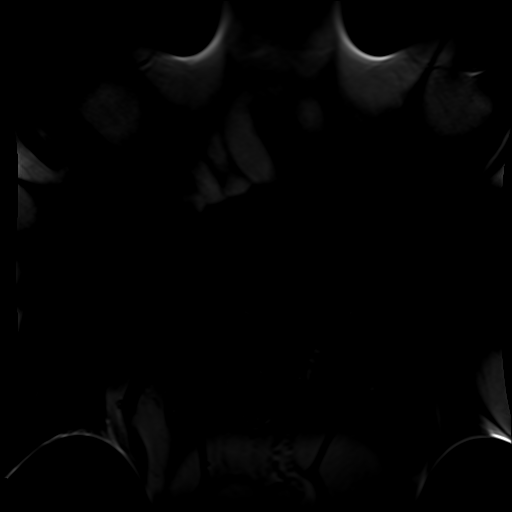
[im 40/40]
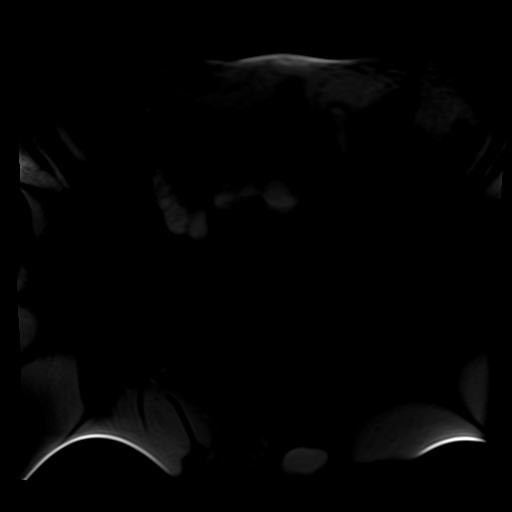

[Series 7: PD fat-sat · sagittal · 4.0mm · 0.94mm/px · 6 of 29 slices shown (1 of 2)]
[im 1/29]
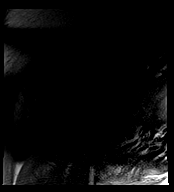
[im 6/29]
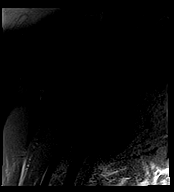
[im 12/29]
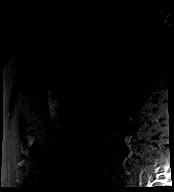
[im 17/29]
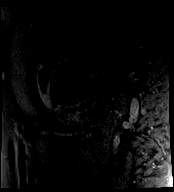
[im 23/29]
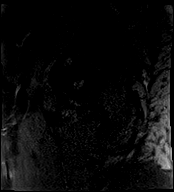
[im 29/29]
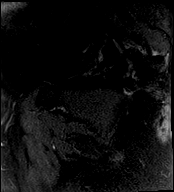

[Series 8: PD fat-sat · coronal · 4.0mm · 0.94mm/px · 5 of 23 slices shown (2 of 2)]
[im 1/23]
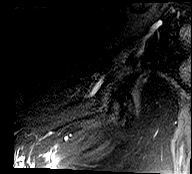
[im 6/23]
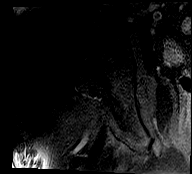
[im 12/23]
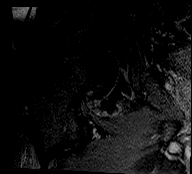
[im 17/23]
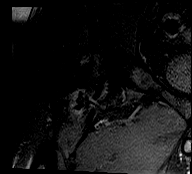
[im 23/23]
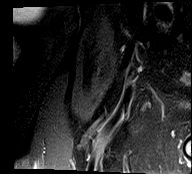

[Series 9: T2 fat-sat · axial · 4.0mm · 0.47mm/px · z∈[-119,+11]mm · 6 of 27 slices shown (2 of 3)]
[im 1/27]
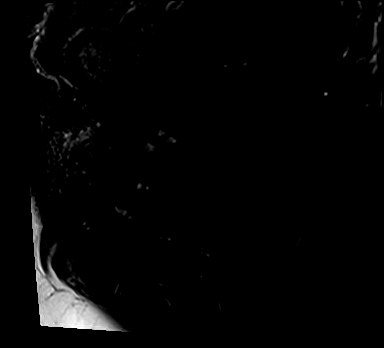
[im 6/27]
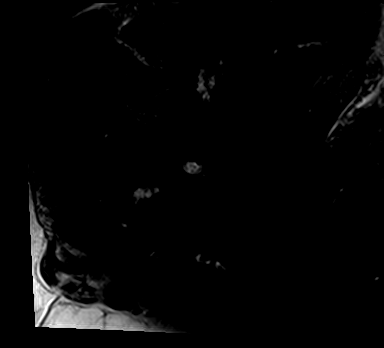
[im 11/27]
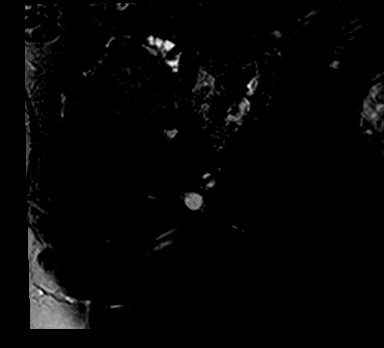
[im 16/27]
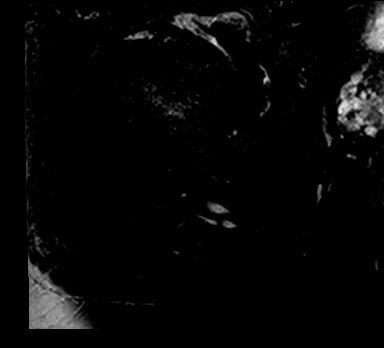
[im 21/27]
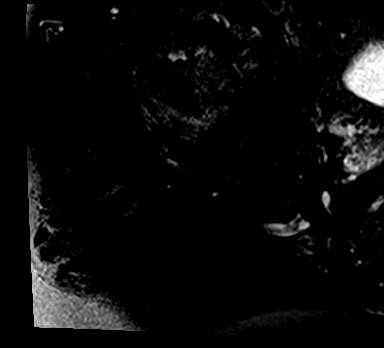
[im 27/27]
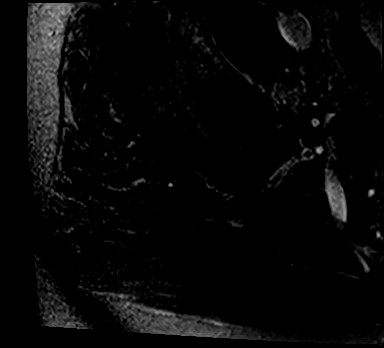

[Series 10: T2 fat-sat · axial · 4.0mm · 0.47mm/px · 1 of 27 slices shown (3 of 3)]
[im 1/27]
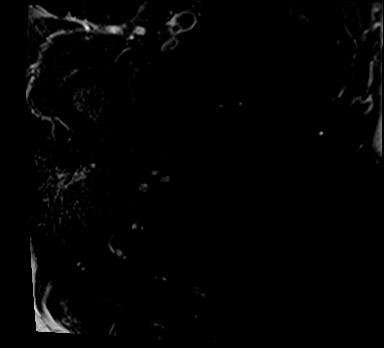

[35 of 40 positions shown; findings below may reference images not displayed]

FINDINGS: Bones: No hip fracture, dislocation or avascular necrosis. No
periosteal reaction or bone destruction. No aggressive osseous
lesion.

Normal sacrum and sacroiliac joints. No SI joint widening or erosive
changes.

Bilateral facet arthropathy of the lower lumbar spine.

Articular cartilage and labrum

Articular cartilage: Extensive full-thickness cartilage loss of the
right femoral head and acetabulum with severe subchondral cystic
changes and marrow edema in the femoral head. Mild subchondral
cystic changes in the right acetabulum. Bulky femoral marginal
osteophytes. Partial-thickness cartilage loss of the left femoral
head and acetabulum.

Labrum:  Extensive right labral degeneration.

Joint or bursal effusion

Joint effusion: Large right hip joint effusion with synovitis. No
left hip joint effusion. No SI joint effusion.

Bursae:  No bursa formation.

Muscles and tendons

Flexors: Normal.

Extensors: Normal.

Abductors: Normal.

Adductors: Normal.

Gluteals: Normal.

Hamstrings: Mild tendinosis of the left hamstring origin with a
small partial-thickness tear. Right hamstring origin is intact.

Other findings

Miscellaneous: No pelvic free fluid. No fluid collection or
hematoma. No inguinal lymphadenopathy. No inguinal hernia.
IMPRESSION: 1. Severe advanced osteoarthritis of the right hip. Large right hip
joint effusion with synovitis.
2. Mild tendinosis of the left hamstring origin with a small
partial-thickness tear.
3. Mild osteoarthritis of the left hip.
